# Patient Record
Sex: Male | Born: 1995 | Race: White | Hispanic: No | Marital: Single
Health system: Southern US, Community
[De-identification: ages and names within clinical notes are randomized; demographics above are authoritative.]

---
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
## 2016-01-25 IMAGING — DX DG KNEE COMPLETE 4+V*L*
4 series · 4 of 4 positions shown · non-contrast
Comparison: None.

CLINICAL DATA: Known injury. Left knee pain and swelling on and off
for months.

EXAM:
LEFT KNEE - COMPLETE 4+ VIEW

[knee ap]
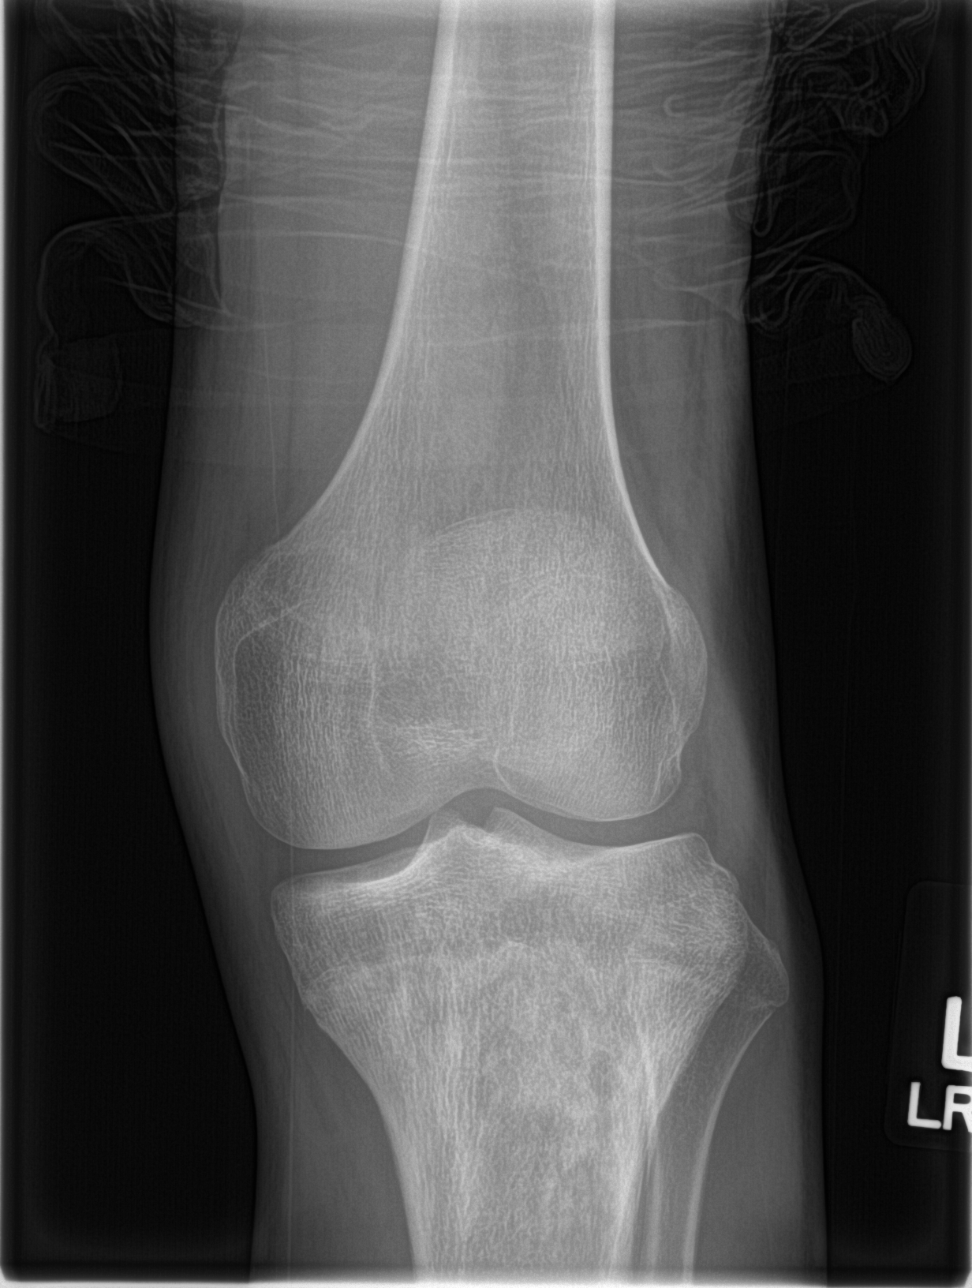

[knee obl (1 of 2)]
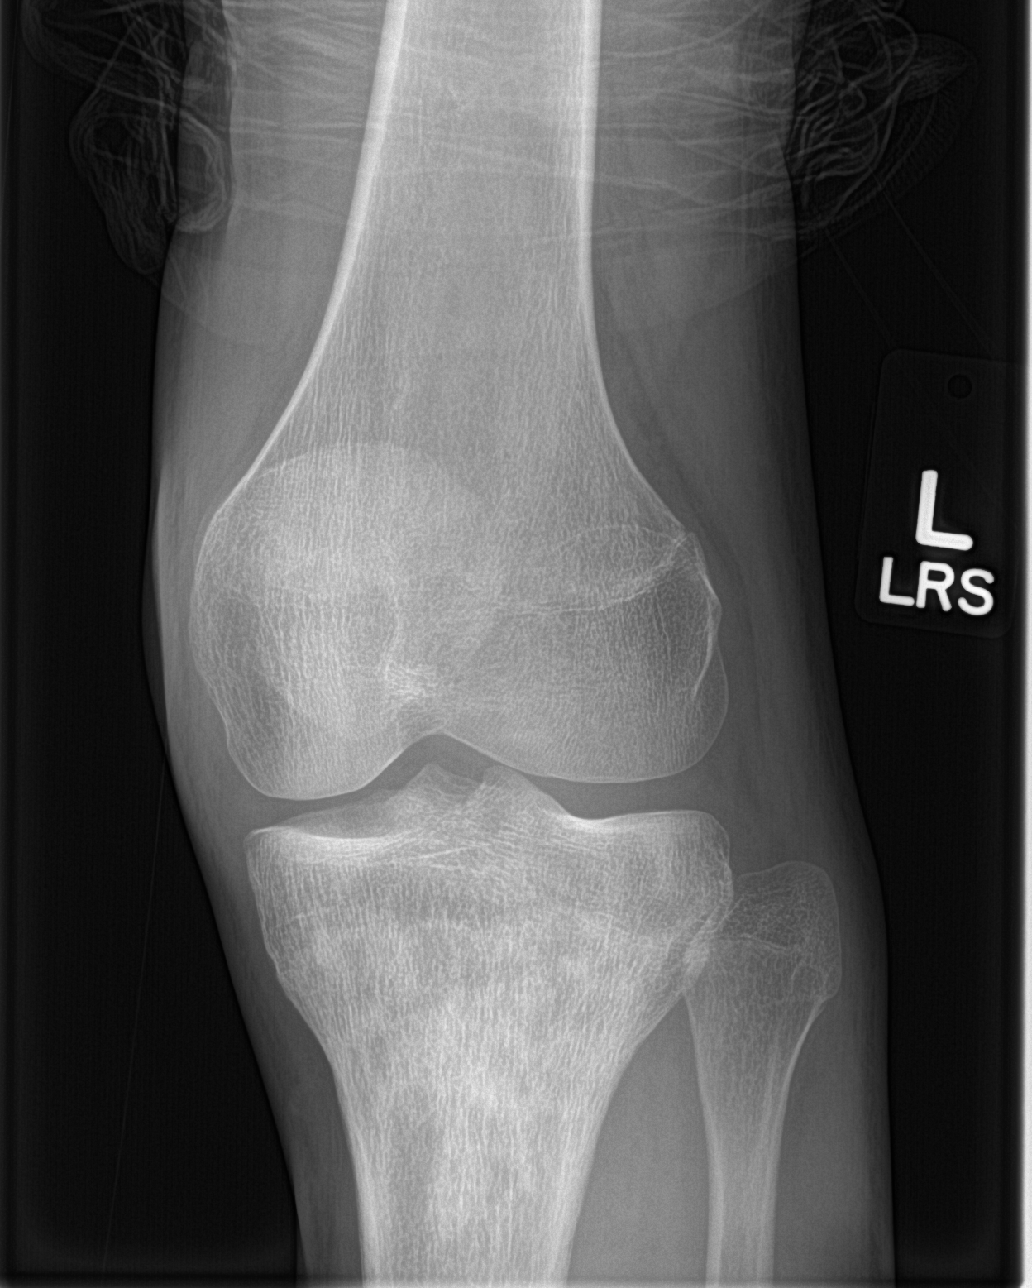

[knee obl (2 of 2)]
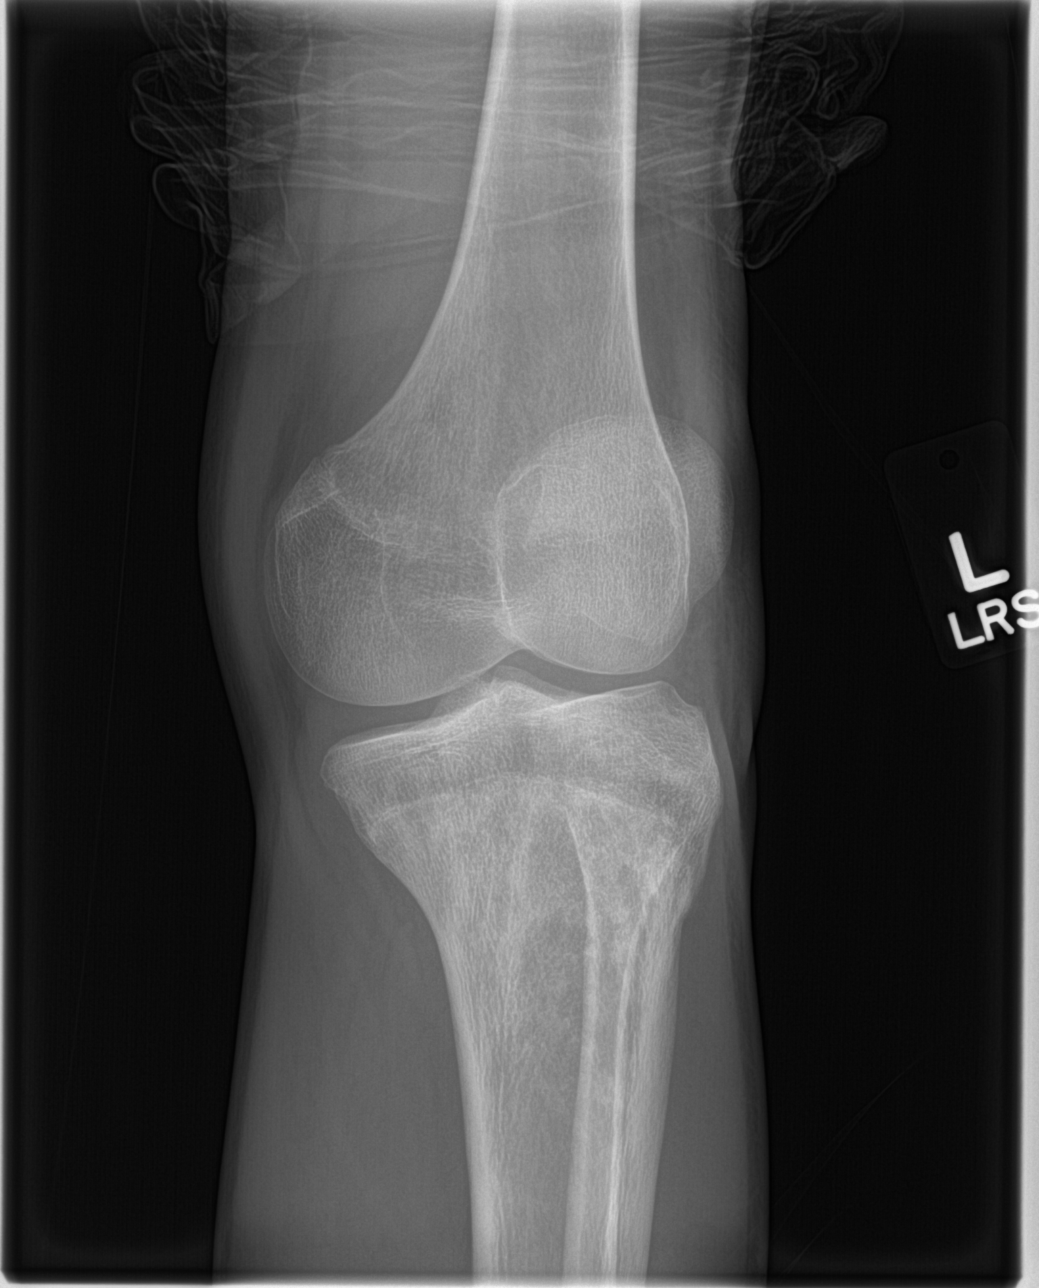

[knee lat]
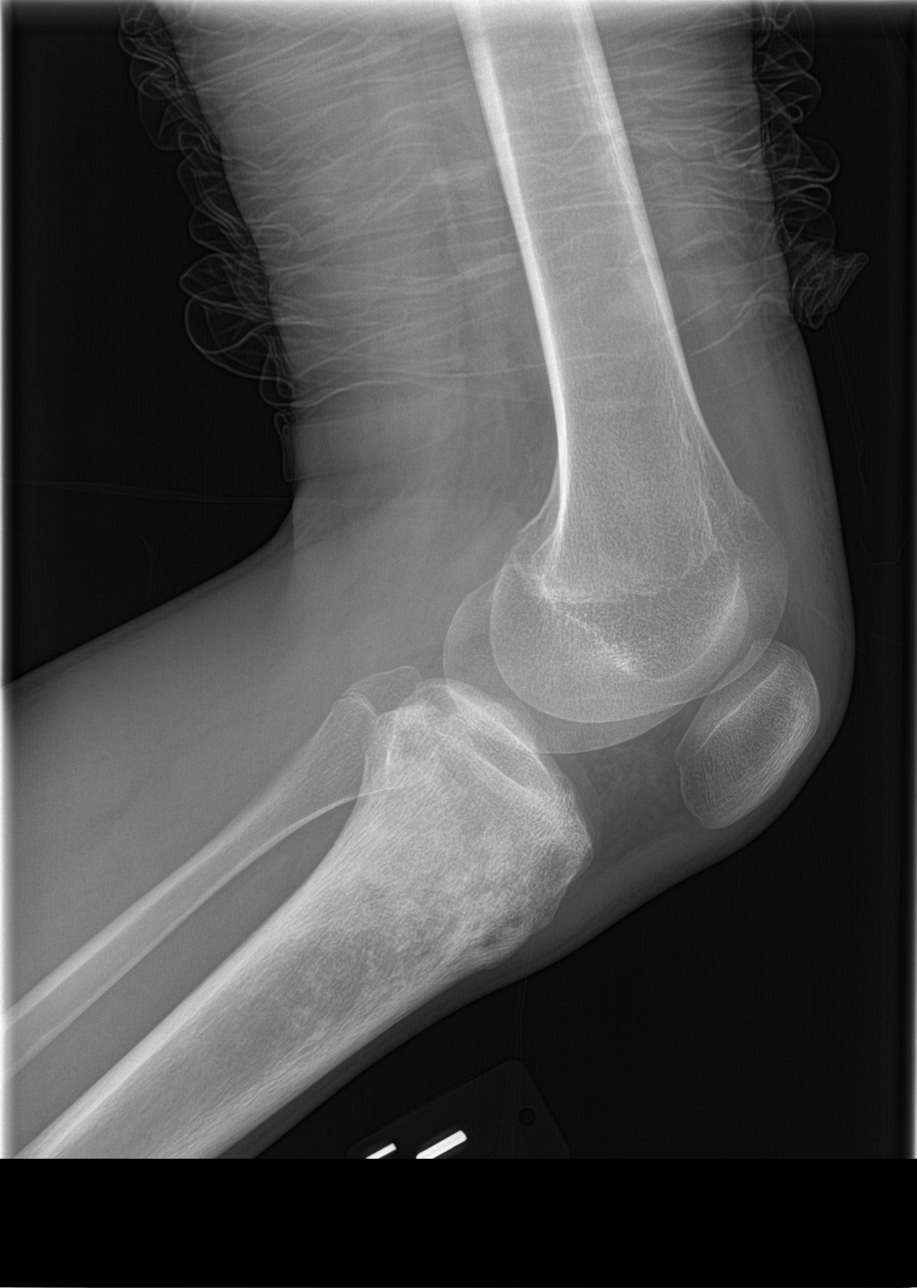

[4 of 4 positions shown; findings below may reference images not displayed]

FINDINGS: Abnormal heterogeneous appearance of the marrow pattern in the
proximal tibial metaphysis with somewhat moth-eaten sclerosis and
lucency. Margins are poorly defined. There is no expansile change,
periosteal reaction, or cortical erosion. Appearance is nonspecific.
Suggest further evaluation with MRI. No evidence of acute fracture
or dislocation. No significant effusion. Soft tissues are
unremarkable.
IMPRESSION: Abnormal appearance of marrow in the proximal tibial metaphysis.
Recommend MRI for further evaluation.

## 2016-01-25 IMAGING — DX DG SHOULDER 2+V*L*
3 series · 3 of 3 positions shown · non-contrast
Comparison: None.

CLINICAL DATA: Shoulder pain and swelling. Onset of symptoms
06/23/2014.

EXAM:
LEFT SHOULDER - 2+ VIEW

[shoulder grashey]
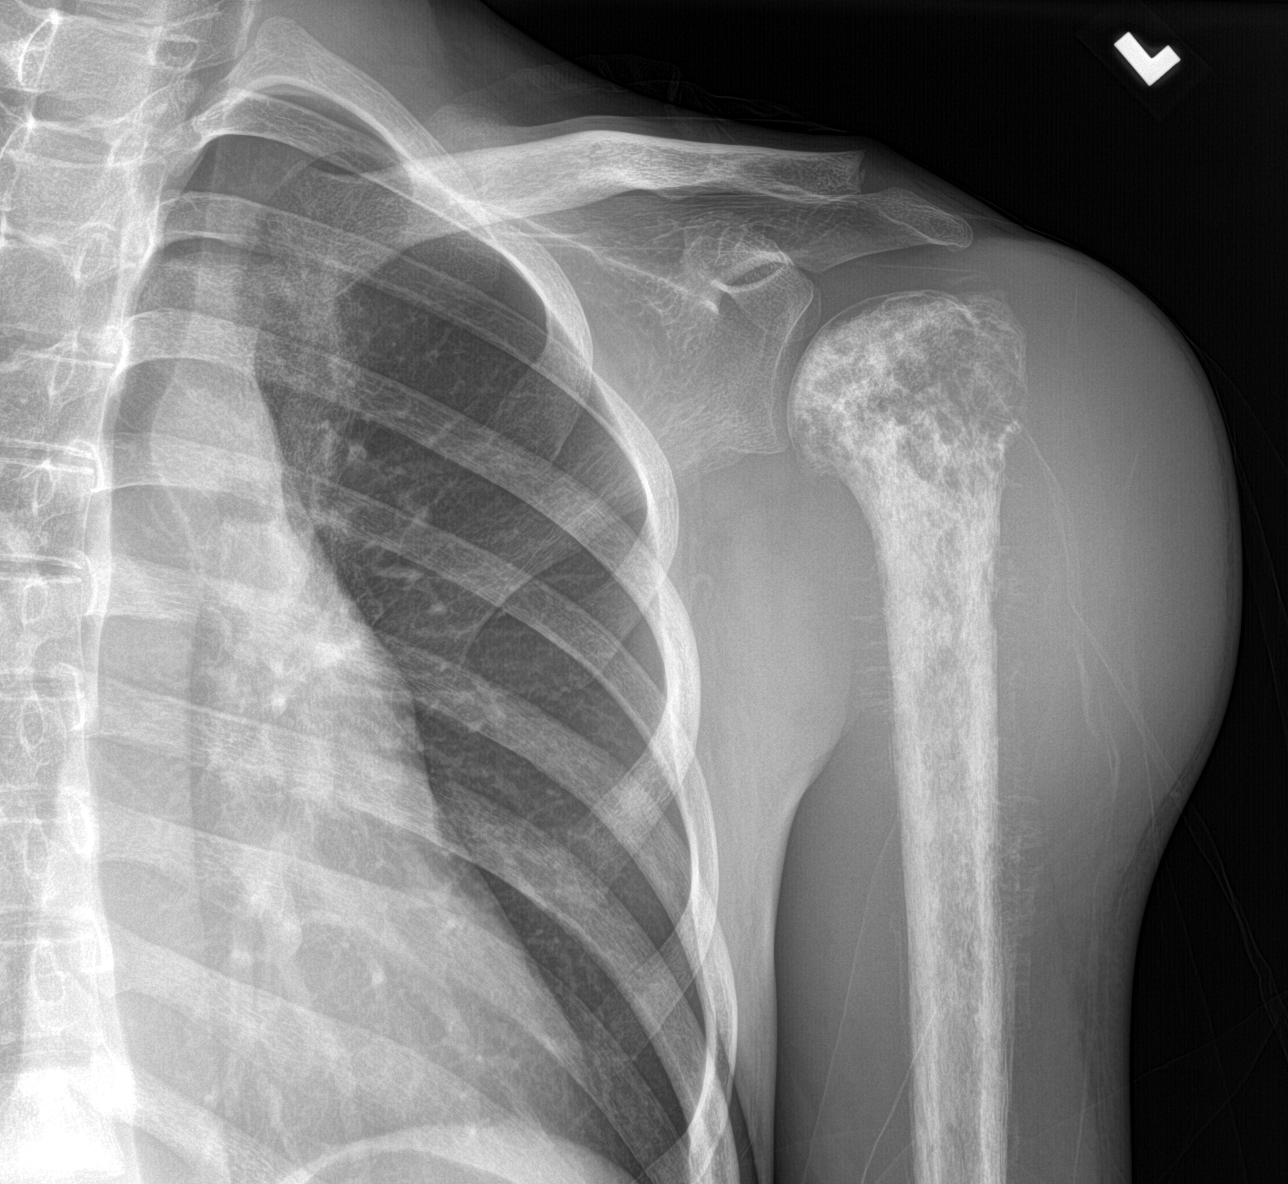

[shoulder y view]
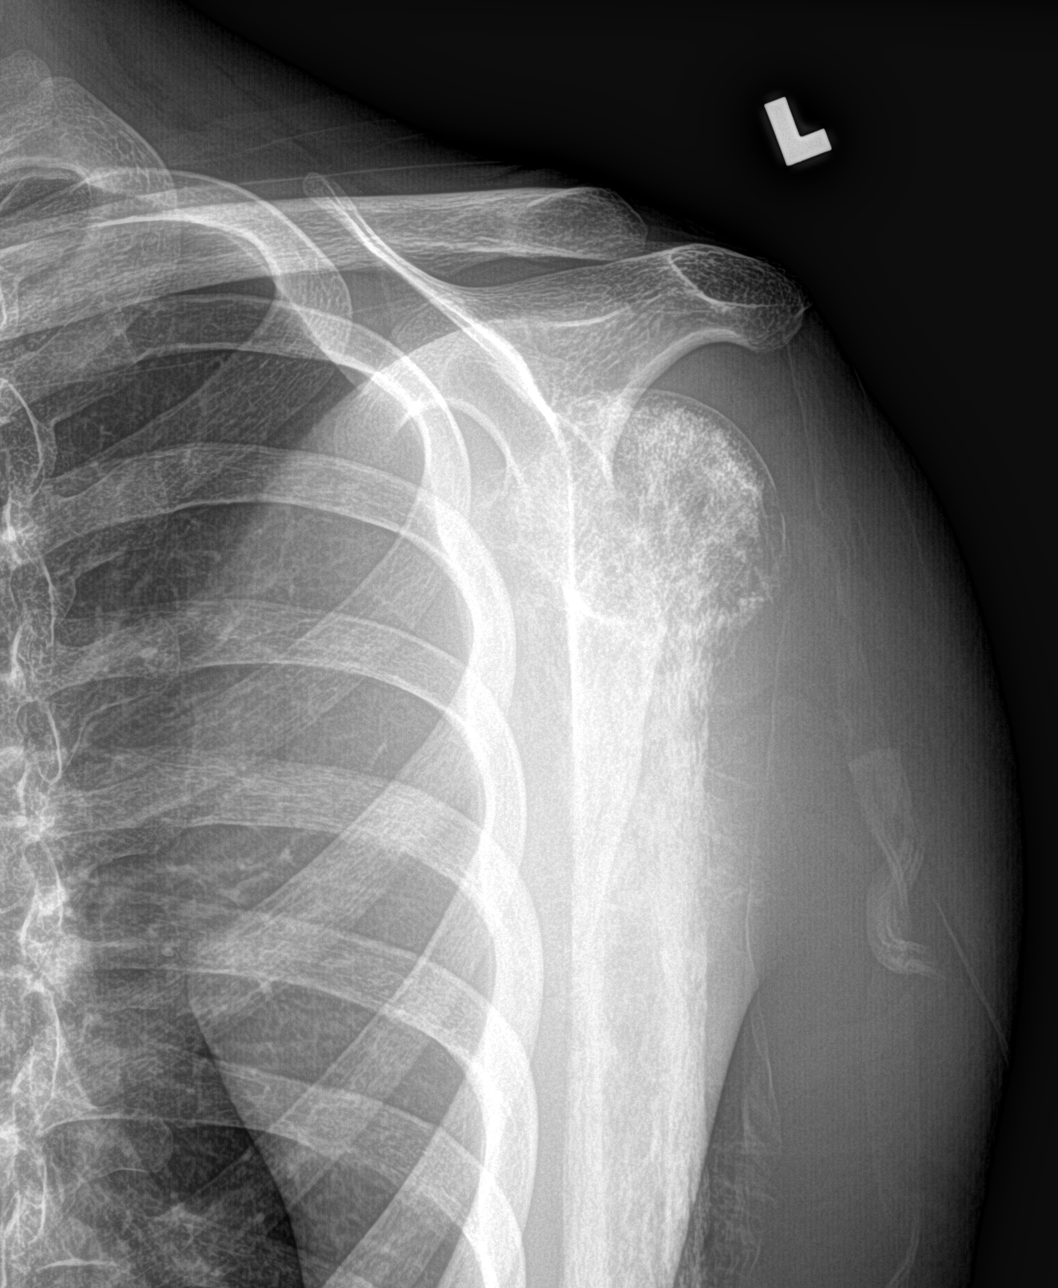

[shoulder axillary]
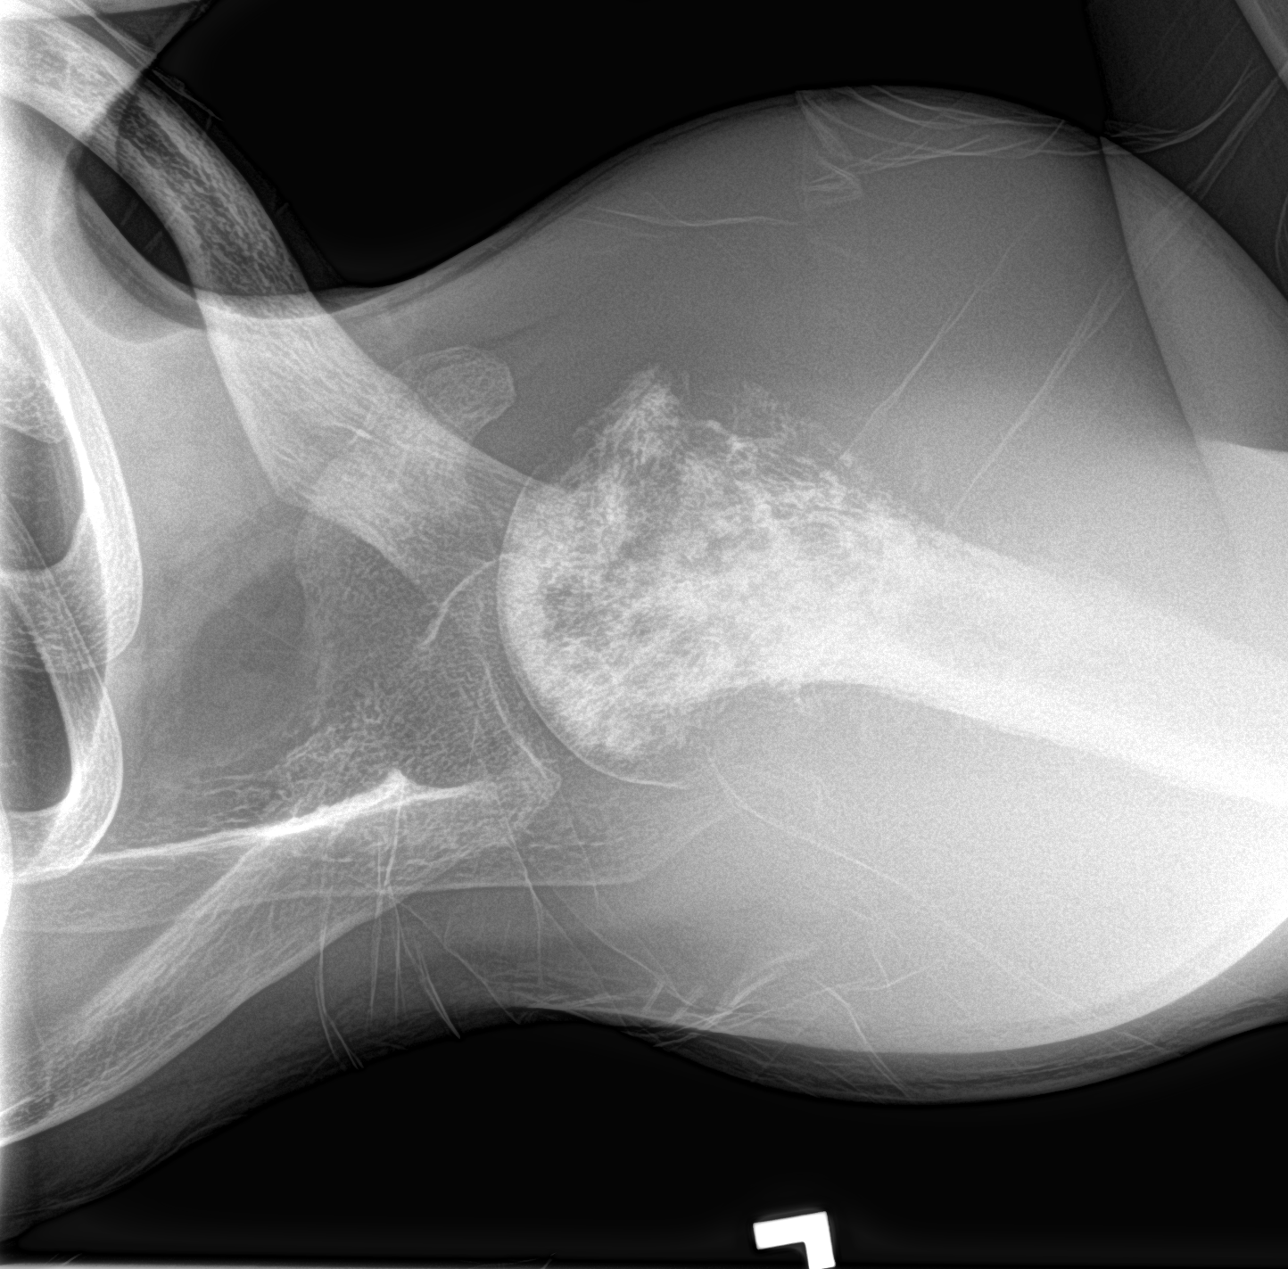

[3 of 3 positions shown; findings below may reference images not displayed]

FINDINGS: Permeative destruction and aggressive periosteal reaction of the
proximal LEFT humerus compatible with neoplasm.Markedly
heterogeneous mineralization of the proximal humerus. There is
marked periosteal reaction along the entire proximal shaft of the
humerus. " Hair on end " periosteal reaction is present, compatible
with an aggressive process and underlying neoplasm. Osteomyelitis is
considered less likely. There is no given history of malignancy.

Soft tissue swelling is present in the lateral LEFT shoulder,
suggesting associated soft tissue component extending of the bone.

No pathologic fracture is present. The glenohumeral joint is
located.
IMPRESSION: Aggressive osteolytic process of the proximal LEFT humerus
compatible with neoplasm. The primary considerations are Ewing
sarcoma, osteosarcoma and osseous lymphoma. Metastatic disease could
produce this appearance also. A benign etiology (EG) is unlikely
given the periosteal reaction. Follow-up MRI of the entire LEFT
humerus with and without contrast is recommended. A noncontrast CT
may also be useful to assess for the amount of bone formation
however this can be deferred until after the MRI. Ultimately,
orthopedic oncology consultation will be needed.
